# Patient Record
Sex: Male | Born: 1975 | Race: White | Hispanic: No | Marital: Married | Smoking: Never smoker
Health system: Southern US, Community
[De-identification: ages and names within clinical notes are randomized; demographics above are authoritative.]

---

## 2006-10-19 ENCOUNTER — Emergency Department: Payer: Self-pay | Admitting: Emergency Medicine

## 2006-11-24 ENCOUNTER — Ambulatory Visit: Payer: Self-pay | Admitting: Orthopaedic Surgery

## 2006-12-02 ENCOUNTER — Ambulatory Visit: Payer: Self-pay | Admitting: Orthopaedic Surgery

## 2008-09-28 ENCOUNTER — Ambulatory Visit: Payer: Self-pay | Admitting: Sports Medicine

## 2009-05-18 ENCOUNTER — Ambulatory Visit: Payer: Self-pay | Admitting: Sports Medicine

## 2011-05-14 ENCOUNTER — Emergency Department: Payer: Self-pay | Admitting: Emergency Medicine

## 2011-10-11 ENCOUNTER — Emergency Department: Payer: Self-pay | Admitting: Emergency Medicine

## 2011-10-20 ENCOUNTER — Emergency Department: Payer: Self-pay | Admitting: Emergency Medicine

## 2013-05-24 IMAGING — CR RIGHT ELBOW - COMPLETE 3+ VIEW
1 series · 4 of 4 positions shown · non-contrast
Comparison: none

REASON FOR EXAM: pulled working out
COMMENTS:

PROCEDURE:     DXR - DXR ELBOW RT COMP W/OBLIQUES  - October 11, 2011  [DATE]
RESULT:     No acute bony or joint abnormality identified.

[Series 1: x elbow lat right · 0.14mm/px · 4 of 4 slices shown]
[im 1/4]
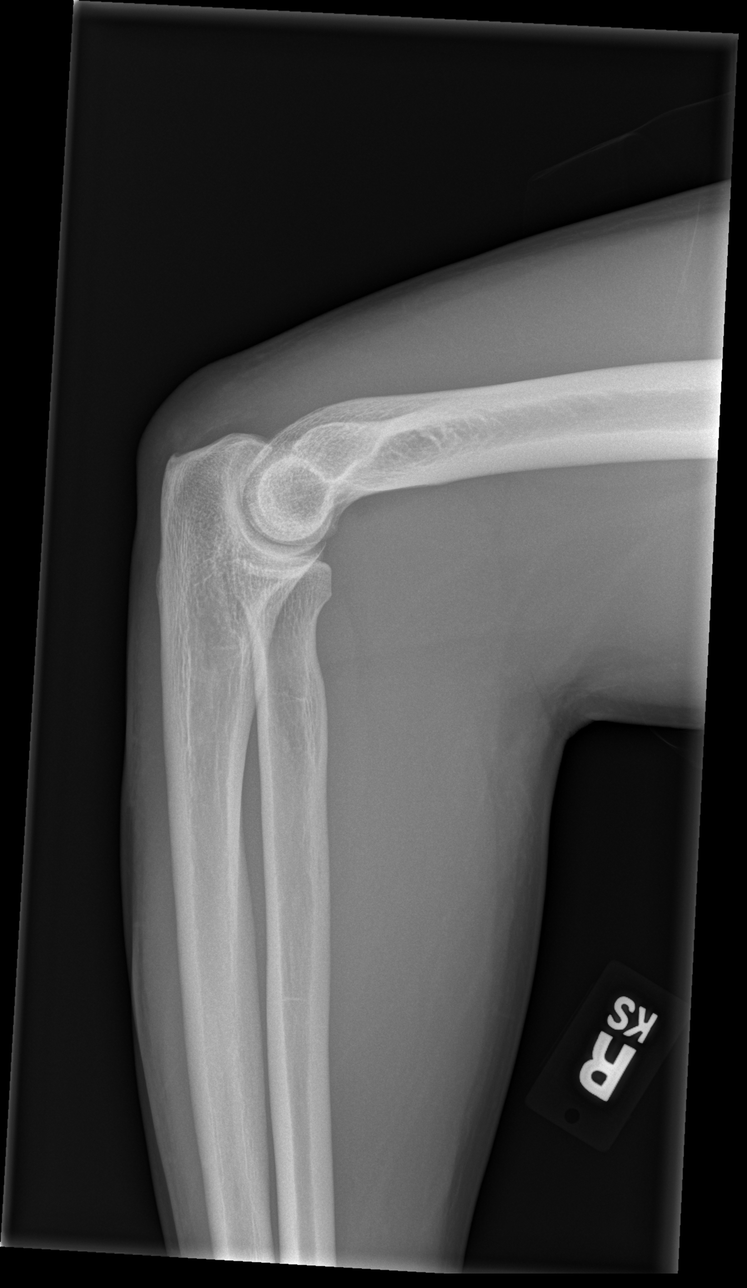
[im 2/4]
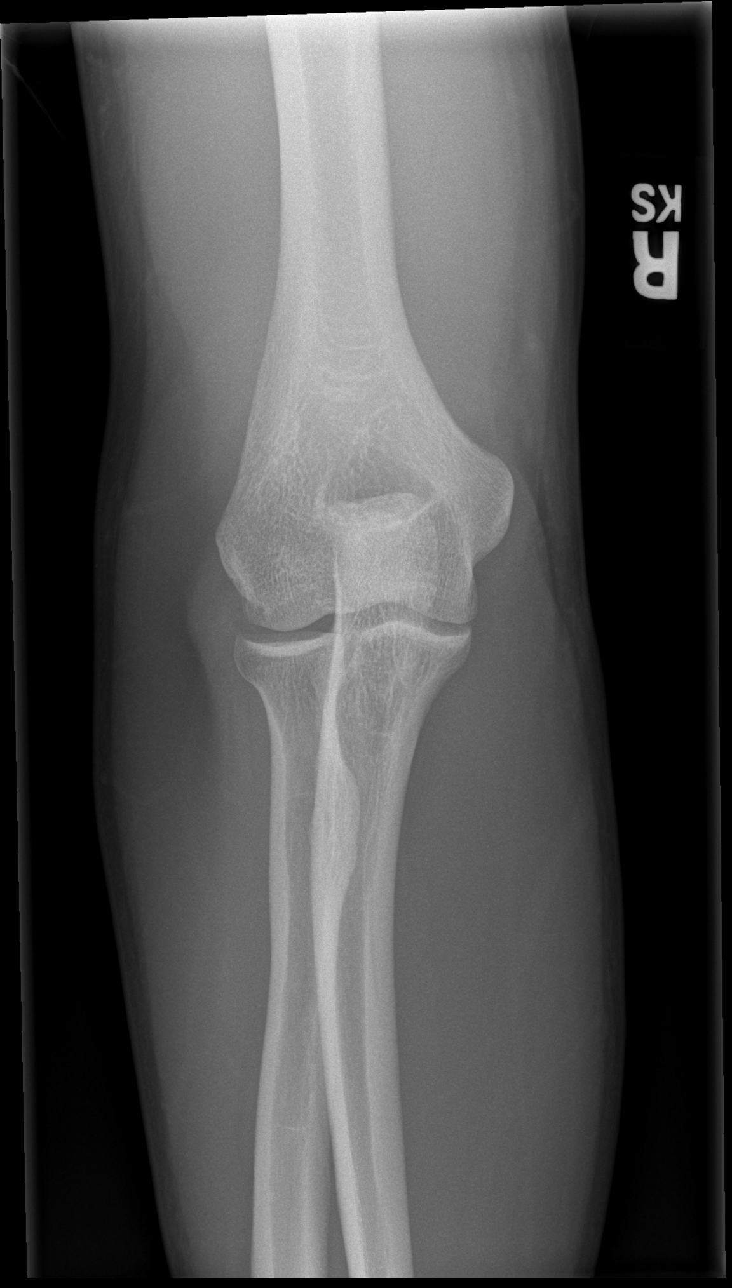
[im 3/4]
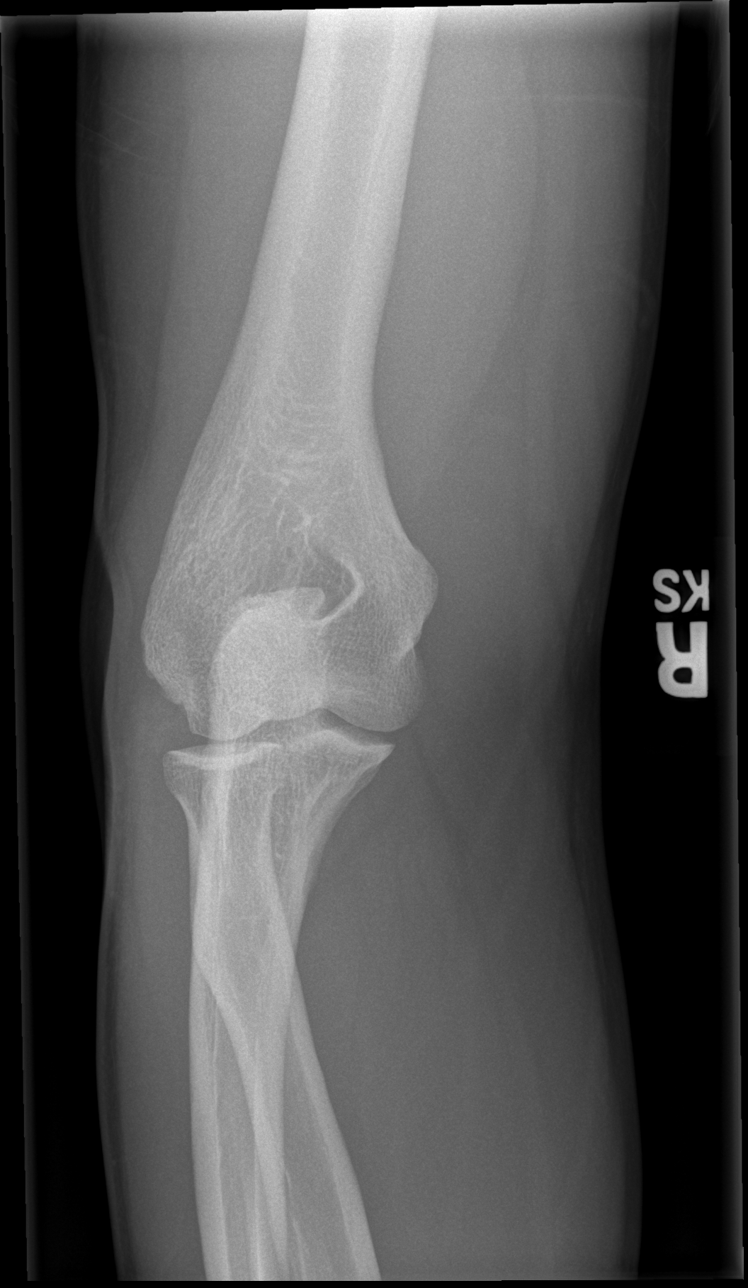
[im 4/4]
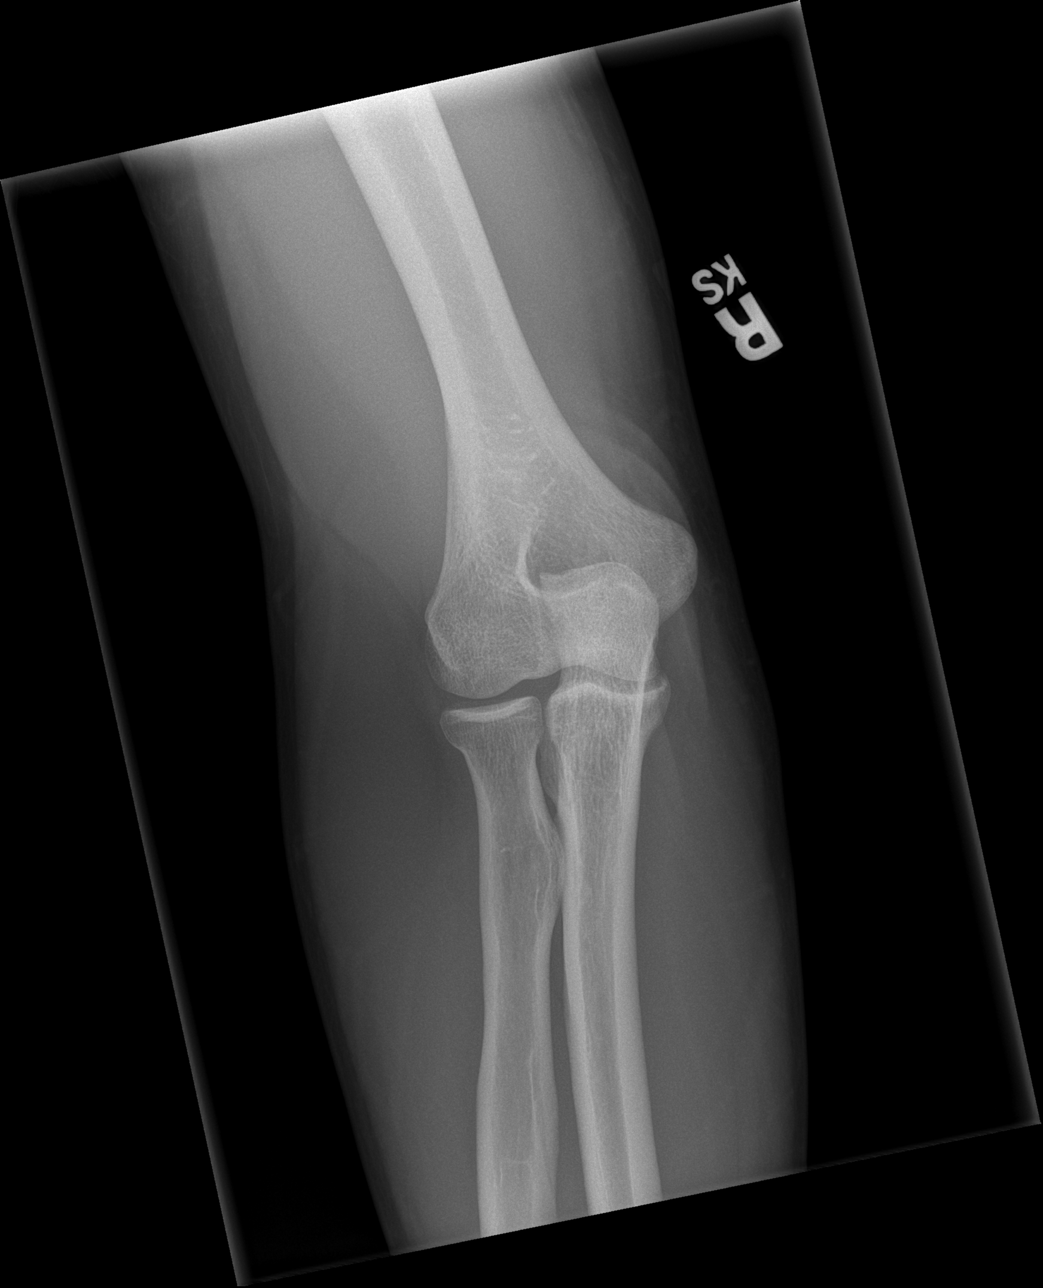

[4 of 4 positions shown; findings below may reference images not displayed]

IMPRESSION: No acute bony or joint abnormality.

## 2020-04-23 ENCOUNTER — Ambulatory Visit: Payer: Self-pay

## 2020-04-26 ENCOUNTER — Ambulatory Visit: Payer: Self-pay | Attending: Internal Medicine

## 2020-04-26 DIAGNOSIS — Z23 Encounter for immunization: Secondary | ICD-10-CM

## 2020-04-26 NOTE — Progress Notes (Signed)
   Covid-19 Vaccination [facility]  Name:  Johnny Cline    MRN: [id number] DOB: 25-Aug-1975  04/26/2020  [name] was observed post Covid-19 immunization for 15 minutes without incident. He was provided with Vaccine Information Sheet and instruction to access the V-Safe system.   [name] was instructed to call 911 with any severe reactions post vaccine: Marland Kitchen Difficulty breathing  . Swelling of face and throat  . A fast heartbeat  . A bad rash all over body  . Dizziness and weakness   Immunizations Administered    Name Date Dose VIS Date Route   JANSSEN COVID-19 VACCINE 04/26/2020  1:47 PM 0.5 mL 02/29/2020 Intramuscular   Manufacturer: [employer]   Lot: [id number]   NDC: [phone]

## 2020-09-12 ENCOUNTER — Ambulatory Visit: Payer: Self-pay | Admitting: Legal Medicine

## 2022-06-05 ENCOUNTER — Ambulatory Visit
Admission: EM | Admit: 2022-06-05 | Discharge: 2022-06-05 | Disposition: A | Discharge status: Home / Self Care | Payer: BLUE CROSS/BLUE SHIELD | Attending: Urgent Care | Admitting: Urgent Care

## 2022-06-05 DIAGNOSIS — J069 Acute upper respiratory infection, unspecified: Secondary | ICD-10-CM | POA: Diagnosis present

## 2022-06-05 DIAGNOSIS — Z1152 Encounter for screening for COVID-19: Secondary | ICD-10-CM | POA: Insufficient documentation

## 2022-06-05 MED ORDER — PREDNISONE 20 MG PO TABS: ORAL_TABLET | ORAL | 0 refills | Status: AC

## 2022-06-05 MED ORDER — BENZONATATE 100 MG PO CAPS: ORAL_CAPSULE | ORAL | 0 refills | Status: AC

## 2022-06-05 NOTE — ED Triage Notes (Signed)
Pt. Presents to UC w/ c/o a cough that causes a "burning" feeling in his lungs, headache, nasal drainage and body aches. Pt. States symptoms started Monday.

## 2022-06-05 NOTE — Discharge Instructions (Addendum)
You have been diagnosed with a viral upper respiratory infection based on your symptoms and exam. Viral illnesses cannot be treated with antibiotics - they are self limiting - and you should find your symptoms resolving within a few days. Get plenty of rest and non-caffeinated fluids. Watch for signs of dehydration including reduced urine output and dark colored urine.  We have performed a respiratory swab testing for COVID. If the results of this testing are positive, someone will call you if you are eligible for any antiviral treatment.    We recommend you use over-the-counter medications for symptom control including acetaminophen (Tylenol), ibuprofen (Advil/Motrin) or naproxen (Aleve) for throat pain, fever, chills or body aches. You may combine use of acetaminophen and ibuprofen/naproxen if needed.  Some patients find an pain-relieving throat spray such as Chloraseptic to be effective.  Also recommend cold/cough medication containing a cough suppressant such as dextromethorphan, as needed.  I have prescribed benzonatate, a cough suppressant that is not sedating.  Saline mist spray is helpful for removing excess mucus from your nose.  Room humidifiers are helpful to ease breathing at night. I recommend guaifenesin (Mucinex) with plenty of water throughout the day to help thin and loosen mucus secretions in your respiratory passages.   If appropriate based upon your other medical problems, you might also find relief of nasal/sinus congestion symptoms by using a nasal decongestant such as fluticasone (Flonase ) or pseudoephedrine (Sudafed sinus).  You will need to obtain Sudafed from behind the pharmacist counter.  Speak to the pharmacist to verify that you are not duplicating medications with other over-the-counter formulations that you may be using.

## 2022-06-05 NOTE — ED Provider Notes (Signed)
[facility]    CSN: [id number] Arrival date & time: 06/05/22  1201      History   Chief Complaint Chief Complaint  Patient presents with   Cough   Headache   Generalized Body Aches    HPI Johnny Cline is a 47 y.o. male.    Cough Associated symptoms: headaches   Headache Associated symptoms: cough     Patient presents to urgent care with a cough that is associated with a "burning" feeling in his lungs.  Also reports headache, nasal drainage, body aches.  Denies fever.  History reviewed. No pertinent past medical history.  There are no problems to display for this patient.   History reviewed. No pertinent surgical history.     Home Medications    Prior to Admission medications   Not on File    Family History History reviewed. No pertinent family history.  Social History Social History   Tobacco Use   Smoking status: Unknown     Allergies   Patient has no known allergies.   Review of Systems Review of Systems  Respiratory:  Positive for cough.   Neurological:  Positive for headaches.     Physical Exam Triage Vital Signs ED Triage Vitals [06/05/22 1228]  Enc Vitals Group     BP 120/81     Pulse Rate 90     Resp 17     Temp 99.2 F (37.3 C)     Temp src      SpO2 96 %     Weight      Height      Head Circumference      Peak Flow      Pain Score 0     Pain Loc      Pain Edu?      Excl. in [place]?    No data found.  Updated Vital Signs BP 120/81   Pulse 90   Temp 99.2 F (37.3 C)   Resp 17   SpO2 96%   Visual Acuity Right Eye Distance:   Left Eye Distance:   Bilateral Distance:    Right Eye Near:   Left Eye Near:    Bilateral Near:     Physical Exam Vitals reviewed.  Constitutional:      Appearance: He is well-developed. He is not ill-appearing.  HENT:     Mouth/Throat:     Pharynx: No oropharyngeal exudate or posterior oropharyngeal erythema.  Cardiovascular:     Rate and Rhythm: Normal rate and  regular rhythm.     Heart sounds: Normal heart sounds.  Pulmonary:     Effort: Pulmonary effort is normal.     Breath sounds: Normal breath sounds.  Neurological:     Mental Status: He is alert and oriented to person, place, and time.  Psychiatric:        Mood and Affect: Mood normal.        Behavior: Behavior normal.      UC Treatments / Results  Labs (all labs ordered are listed, but only abnormal results are displayed) Labs Reviewed - No data to display  EKG   Radiology No results found.  Procedures Procedures (including critical care time)  Medications Ordered in UC Medications - No data to display  Initial Impression / Assessment and Plan / UC Course  I have reviewed the triage vital signs and the nursing notes.  Pertinent labs & imaging results that were available during my care of the patient were  reviewed by me and considered in my medical decision making (see chart for details).   Patient is afebrile here without recent antipyretics. Satting well on room air. Overall is well appearing, well hydrated, without respiratory distress. Pulmonary exam is unremarkable.  Lungs CTAB without wheezing, rhonchi, rales.  Pharyngeal erythema or peritonsillar exudates.  Patient's symptoms are consistent with an acute viral process including influenza or COVID.  He is outside the treatment window for influenza so antiviral therapy is not considered.  He is at the limits of antiviral therapy for COVID and symptoms seem mild so would not consider antiviral if positive.  Will provide benzonatate for cough as well as anti-inflammatory corticosteroid for treatment of sinus symptoms and cough.  Otherwise recommending continued use of OTC medication for symptom control.  Final Clinical Impressions(s) / UC Diagnoses   Final diagnoses:  None   Discharge Instructions   None    ED Prescriptions   None    PDMP not reviewed this encounter.   Rose Phi, [place] 06/05/22  1251

## 2022-06-06 LAB — SARS CORONAVIRUS 2 (TAT 6-24 HRS): SARS Coronavirus 2: NEGATIVE

## 2022-06-19 ENCOUNTER — Other Ambulatory Visit: Payer: Self-pay | Admitting: Family

## 2022-06-19 DIAGNOSIS — M1A09X1 Idiopathic chronic gout, multiple sites, with tophus (tophi): Secondary | ICD-10-CM

## 2022-09-05 ENCOUNTER — Emergency Department: Payer: BLUE CROSS/BLUE SHIELD

## 2022-09-05 ENCOUNTER — Other Ambulatory Visit: Payer: Self-pay

## 2022-09-05 ENCOUNTER — Emergency Department
Admission: EM | Admit: 2022-09-05 | Discharge: 2022-09-05 | Disposition: A | Discharge status: Home / Self Care | Payer: BLUE CROSS/BLUE SHIELD | Attending: Emergency Medicine | Admitting: Emergency Medicine

## 2022-09-05 DIAGNOSIS — R0789 Other chest pain: Secondary | ICD-10-CM | POA: Diagnosis present

## 2022-09-05 LAB — CBC
HCT: 44.4 % (ref 39.0–52.0)
Hemoglobin: 15.4 g/dL (ref 13.0–17.0)
MCH: 31.1 pg (ref 26.0–34.0)
MCHC: 34.7 g/dL (ref 30.0–36.0)
MCV: 89.7 fL (ref 80.0–100.0)
Platelets: 291 10*3/uL (ref 150–400)
RBC: 4.95 MIL/uL (ref 4.22–5.81)
RDW: 12.6 % (ref 11.5–15.5)
WBC: 6.7 10*3/uL (ref 4.0–10.5)
nRBC: 0 % (ref 0.0–0.2)

## 2022-09-05 LAB — BASIC METABOLIC PANEL
Anion gap: 8 (ref 5–15)
BUN: 25 mg/dL — ABNORMAL HIGH (ref 6–20)
CO2: 24 mmol/L (ref 22–32)
Calcium: 9.6 mg/dL (ref 8.9–10.3)
Chloride: 106 mmol/L (ref 98–111)
Creatinine, Ser: 1.16 mg/dL (ref 0.61–1.24)
GFR, Estimated: >60 mL/min (ref >60)
Glucose, Bld: 107 mg/dL — ABNORMAL HIGH (ref 70–99)
Potassium: 4.3 mmol/L (ref 3.5–5.1)
Sodium: 138 mmol/L (ref 135–145)

## 2022-09-05 LAB — TROPONIN I (HIGH SENSITIVITY)
Troponin I (High Sensitivity): 3 ng/L (ref <18)
Troponin I (High Sensitivity): 4 ng/L (ref <18)

## 2022-09-05 LAB — D-DIMER, QUANTITATIVE: D-Dimer, Quant: 0.37 ug/mL-FEU (ref 0.00–0.50)

## 2022-09-05 NOTE — ED Provider Notes (Signed)
[facility] Provider Note    Event Date/Time   First MD Initiated Contact with Patient 09/05/22 1427     (approximate)   History   Chest Pain   HPI  LARS JEZIORSKI is a 47 y.o. male with no history of cardiac disease who presents with complaints of left-sided chest pain, now resolved.  Patient reports he was warming up and lifting some light weights this morning around 11:00 when he developed a sharp pain in his left breast, he reports that resolved within about an hour however he still has some aching in that area.  No fevers or chills or cough.  No shortness of breath.  No calf pain or swelling.  No history of DVTs.  Pain did not radiate to the back     Physical Exam   Triage Vital Signs: ED Triage Vitals  Enc Vitals Group     BP 09/05/22 1232 (!) 126/97     Pulse Rate 09/05/22 1232 69     Resp 09/05/22 1232 18     Temp 09/05/22 1232 98 F (36.7 C)     Temp Source 09/05/22 1232 Oral     SpO2 09/05/22 1232 98 %     Weight --      Height --      Head Circumference --      Peak Flow --      Pain Score 09/05/22 1230 0     Pain Loc --      Pain Edu? --      Excl. in [place]? --     Most recent vital signs: Vitals:   09/05/22 1232 09/05/22 1600  BP: (!) 126/97 (!) 129/91  Pulse: 69 62  Resp: 18 17  Temp: 98 F (36.7 C)   SpO2: 98% 99%     General: Awake, no distress.  CV:  Good peripheral perfusion.  Resp:  Normal effort.  Abd:  No distention.  Other:  No calf swelling or edema  ED Results / Procedures / Treatments   Labs (all labs ordered are listed, but only abnormal results are displayed) Labs Reviewed  BASIC METABOLIC PANEL - Abnormal; Notable for the following components:      Result Value   Glucose, Bld 107 (*)    BUN 25 (*)    All other components within normal limits  CBC  D-DIMER, QUANTITATIVE  TROPONIN I (HIGH SENSITIVITY)  TROPONIN I (HIGH SENSITIVITY)     EKG  ED ECG REPORT I, Jene Every, the attending  physician, personally viewed and interpreted this ECG.  Date: 09/05/2022  Rhythm: normal sinus rhythm QRS Axis: normal Intervals: normal ST/T Wave abnormalities: normal Narrative Interpretation: no evidence of acute ischemia    RADIOLOGY Chest x-ray viewed interpreted by me, no acute abnormality    PROCEDURES:  Critical Care performed:   Procedures   MEDICATIONS ORDERED IN ED: Medications - No data to display   IMPRESSION / MDM / ASSESSMENT AND PLAN / ED COURSE  I reviewed the triage vital signs and the nursing notes. Patient's presentation is most consistent with acute presentation with potential threat to life or bodily function.   Presents with chest pain as detailed above, differential includes ACS, muscle spasm, less likely PE as low risk.  Chest x-ray negative for pneumothorax or pneumonia  Initial high sensitive troponin is normal, EKG is overall reassuring, lab work is unremarkable.  Will send delta troponin, add on D-dimer as well  Second troponin is normal,  D-dimer is normal, patient remains asymptomatic, outpatient referral to cardiology, return precautions discussed     FINAL CLINICAL IMPRESSION(S) / ED DIAGNOSES   Final diagnoses:  Atypical chest pain     Rx / DC Orders   ED Discharge Orders          Ordered    Ambulatory referral to Cardiology       Comments: If you have not heard from the Cardiology office within the next 72 hours please call [phone].   09/05/22 1554             Note:  This document was prepared using Dragon voice recognition software and may include unintentional dictation errors.   Jene Every, MD 09/05/22 [phone]

## 2022-09-05 NOTE — ED Triage Notes (Signed)
Pt to ED via POV from home. Pt reports he was working out in his garage and racking a weight when he started having left sided CP and SOB. Pt reports pain has subsided. Pt denies cardiac hx. Pt denies blood thinners.

## 2023-05-21 ENCOUNTER — Ambulatory Visit: Payer: Self-pay | Admitting: Family Medicine

## 2023-06-02 ENCOUNTER — Telehealth: Payer: Self-pay | Admitting: General Practice

## 2023-06-02 NOTE — Telephone Encounter (Signed)
LVM--Due to possible inclement weather, We have rescheduled your appointment to 07/09/2023 @ 9:20am . Call our office if you need to reschedule. Sorry for any inconvenience. (It's ok for [facility] to reschedule appt if patient needs to)

## 2023-06-03 ENCOUNTER — Ambulatory Visit: Payer: Self-pay | Admitting: Family Medicine

## 2023-07-09 ENCOUNTER — Ambulatory Visit: Payer: BLUE CROSS/BLUE SHIELD | Admitting: Family Medicine
